# Patient Record
Sex: Male | Born: 1995 | Race: White | Hispanic: No | Marital: Single | Smoking: Current every day smoker
Health system: Southern US, Community
[De-identification: ages and names within clinical notes are randomized; demographics above are authoritative.]

## PROBLEM LIST (undated history)

## (undated) DIAGNOSIS — N5089 Other specified disorders of the male genital organs: Secondary | ICD-10-CM

---

## 2018-06-14 ENCOUNTER — Other Ambulatory Visit: Payer: Self-pay

## 2018-06-14 ENCOUNTER — Emergency Department (HOSPITAL_COMMUNITY): Payer: Self-pay

## 2018-06-14 ENCOUNTER — Emergency Department (HOSPITAL_COMMUNITY)
Admission: EM | Admit: 2018-06-14 | Discharge: 2018-06-14 | Disposition: A | Discharge status: Home / Self Care | Payer: Self-pay | Attending: Emergency Medicine | Admitting: Emergency Medicine

## 2018-06-14 ENCOUNTER — Encounter (HOSPITAL_COMMUNITY): Payer: Self-pay | Admitting: *Deleted

## 2018-06-14 DIAGNOSIS — F17200 Nicotine dependence, unspecified, uncomplicated: Secondary | ICD-10-CM | POA: Insufficient documentation

## 2018-06-14 DIAGNOSIS — R45851 Suicidal ideations: Secondary | ICD-10-CM | POA: Insufficient documentation

## 2018-06-14 DIAGNOSIS — M25571 Pain in right ankle and joints of right foot: Secondary | ICD-10-CM | POA: Insufficient documentation

## 2018-06-14 DIAGNOSIS — F15121 Other stimulant abuse with intoxication delirium: Secondary | ICD-10-CM | POA: Insufficient documentation

## 2018-06-14 DIAGNOSIS — F1910 Other psychoactive substance abuse, uncomplicated: Secondary | ICD-10-CM

## 2018-06-14 HISTORY — DX: Other specified disorders of the male genital organs: N50.89

## 2018-06-14 LAB — CBC WITH DIFFERENTIAL/PLATELET
Basophils Absolute: 0 10*3/uL (ref 0.0–0.1)
Basophils Relative: 0 %
EOS PCT: 2 %
Eosinophils Absolute: 0.2 10*3/uL (ref 0.0–0.7)
HCT: 39.5 % (ref 39.0–52.0)
Hemoglobin: 13 g/dL (ref 13.0–17.0)
LYMPHS ABS: 1.3 10*3/uL (ref 0.7–4.0)
LYMPHS PCT: 14 %
MCH: 29.5 pg (ref 26.0–34.0)
MCHC: 32.9 g/dL (ref 30.0–36.0)
MCV: 89.8 fL (ref 78.0–100.0)
MONO ABS: 0.9 10*3/uL (ref 0.1–1.0)
MONOS PCT: 9 %
Neutro Abs: 7 10*3/uL (ref 1.7–7.7)
Neutrophils Relative %: 75 %
PLATELETS: 297 10*3/uL (ref 150–400)
RBC: 4.4 MIL/uL (ref 4.22–5.81)
RDW: 13.2 % (ref 11.5–15.5)
WBC: 9.3 10*3/uL (ref 4.0–10.5)

## 2018-06-14 LAB — ETHANOL: Alcohol, Ethyl (B): <10 mg/dL (ref <10)

## 2018-06-14 LAB — BASIC METABOLIC PANEL
Anion gap: 9 (ref 5–15)
BUN: 20 mg/dL (ref 6–20)
CO2: 24 mmol/L (ref 22–32)
Calcium: 9.2 mg/dL (ref 8.9–10.3)
Chloride: 106 mmol/L (ref 98–111)
Creatinine, Ser: 0.92 mg/dL (ref 0.61–1.24)
GFR calc Af Amer: >60 mL/min (ref >60)
GLUCOSE: 106 mg/dL — AB (ref 70–99)
POTASSIUM: 3.9 mmol/L (ref 3.5–5.1)
Sodium: 139 mmol/L (ref 135–145)

## 2018-06-14 LAB — RAPID URINE DRUG SCREEN, HOSP PERFORMED
AMPHETAMINES: POSITIVE — AB
BENZODIAZEPINES: NOT DETECTED
COCAINE: NOT DETECTED
Opiates: NOT DETECTED
Tetrahydrocannabinol: NOT DETECTED

## 2018-06-14 NOTE — ED Notes (Signed)
PATIENT STATES HE FELL OFF A LADDER THIS AM BUT IS UNSURE ABOUT THE LOCATION, PER ems patient was acting out on transfer , patient states he was scared, calm at present. C/o pain in right ankle. wanded by security on admission.

## 2018-06-14 NOTE — ED Notes (Signed)
TTS in progress 
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
## 2018-06-14 NOTE — ED Triage Notes (Signed)
Brought in via [facility] after using crystal meth

## 2018-06-14 NOTE — BHH Counselor (Signed)
[name], RN, Charged Nurse notified patient is psych-cleared and can be discharged.

## 2018-06-14 NOTE — Discharge Instructions (Addendum)
Substance Abuse Treatment Programs ° °Intensive Outpatient Programs °[facility]     °[address]      °[place], [place]                   °[phone]      ° °[facility] °[address] °[place], [place] °[phone] ° °[facility]     °(Inpatient and outpatient)     °[address]           °[phone]   ° °[facility] °336-288-1484 (Suboxone and Methadone) ° °[address]      °[place], [place] [address]      °336-882-2125      ° °[address] °[place], [place] °852-3033 ° °[facility] (Outpatient/Inpatient, Chemical)    °(insurance only) [phone]      °       °[facility] (Groups & Residential) °[place], [place] °336-389-1413 ° °   °[facility]     °[address]     °[place], [place]      °[phone]      ° °[facility] (for caregivers and family) °[address] °[place], [place] °336-299-4655 ° ° ° ° ° °Residential Treatment Programs °[facility]      °[address], [place], [place] [address]  °([phone]      ° °[facility] °[address] [place], [place] [address] °[phone] ° °[facility]        °336-248-8914      ° °[facility] °1-800-659-3381 ° °[facility]             °[address]                                         °[place], [place]                                                °877-615-2722 or 336-784-9470                              ° °[facility] [place] °[address] °[place], [address] °1.877.941.8954 ° °[facility]    °[address]      °, [place]     °336-273-5306      ° °[facility] °[address] °, [place] °336-285-9073 ° °[facility]   °[address]     °[place], [place] [address]     °336-899-1550      °Admissions: 8am-3pm M-F ° °[facility] °[address] °[place] °336-227-7417 ° °[facility]: Residential Program (90 Days)   °[place], [place]      °336-725-8389 or 800-758-6077    ° °[facility] °[place], [place] °(Walk in Hours over the weekend or by referral) ° °[facility] °[address], [place], [place] [address] °(336) [phone] ° °Crisis Mobile: [facility]:  1-877-626-1772 (for crisis response 24 hours a day) °[facility] Hotline:      1-800-256-2452 °Outpatient Psychiatry and Counseling ° °[facility]: Mobile Crisis   Management 24 hours:  1-877-626-1772 ° °[facility] sliding scale fee and walk in schedule: M-F 8am-12pm/1pm-3pm °[address]  °[place], [place] [address] °336-387-6161 ° °[facility] °[address] °[place], [place] [address] °336-703-9650 ° °[facility] ([facility])- new patient walk-in appointments available Monday - Friday 8am -3pm.          °[address] °[place], [place] [address] °336-676-6840 or crisis line- 336-676-6905 ° °[facility] Intensive Outpatient Therapy Program °[address] °[place], [place] [address] °336-832-9804 ° °[facility]                  °Crisis Services      °336.641.4993      °[address]     °[place], [place] [address]                ° °[facility]   °[facility] °[phone] °[address] °[place], [place] [address] ° ° °[facility]          °[address]  °[place], [place] [address]       °(336) [phone] ° °[facility] °[address] °[place], [place] [address] °336-292-1510 ° °[facility]    °[address]    °[place], [place] [address]     °336-632-3505      ° °Parish McKinney, MD     °[address]     °[place] [address]     °336-282-1251     °  °[facility] °[address] °[place] [address] ° °[facility]     °[address]     °[place], [place]      °336-542-2076      ° °[facility] °Jacob Ahluwalia, MD °[address] °[place], [place] [address] °336-420-9558 ° °[facility]     °[address]     °[place], [place] [address]     °336-274-1237      ° °[facility] °[address] °[place], [place] [address] °336-854-4450 °Resources for Temporary Residential Assistance/Crisis Centers ° °DAY CENTERS °[facility] °M-F 8am-3pm   °[address] [place], [place] [address]   336-332-0824 °Services include: laundry, barbering, support groups, case management, phone  & computer access, showers, AA/NA mtgs, mental health/substance abuse nurse, job skills class, disability information, VA assistance, spiritual classes, etc.  ° °HOMELESS SHELTERS ° °[facility]     °[facility]   °[address], [place]     °336.271.5959       °       °[facility] (women and children)       °[address] °[place], [place] [address] °336-275-0820 °[email or web address] for application and process °Application Required ° °[facility]   °[address]    °[place] [address]     °336.886.4922       °             °[facility] °[address] °[place], [place] [address] °336.273.5572 °336-235-0363(schedule application appt.) °Application Required ° °[facility] (women only)    °[address]     °[place], [place] [address]     °336-884-1039      °  Intake starts 6pm daily Need valid ID, SSC, & Police report [facility] [place] [address] [place], [place] [phone] Application Required  [facility] (men only)     [address]      [place], [place]     [phone]       [facility] (Pregnant women only) [address] [place], [place] [phone]  [facility]      [address]      [place], [place] [address]     [phone]             [facility] [address] [place], [place] [phone] 90 day commitment/SA/Application process  [facility](men only)     [address]     [place], [place]     [phone]       Check-in at [facility]            [facility] [address] [place], [place] [address] [phone] Men/Women/Women and Children must be there by 7 pm  [facility] [place], [place] [phone]                   Call your regular medical doctor today to schedule a follow up appointment within the next week.  Call the mental health/substance abuse resources given to you today as needed for detox programs. Return to the Emergency Department immediately sooner if worsening.

## 2018-06-14 NOTE — ED Triage Notes (Signed)
States he fell off a ladder prior to arrival but does not know where he was., states he used CRYSTAL METH AT 0300 AM

## 2018-06-14 NOTE — ED Provider Notes (Signed)
[facility] EMERGENCY DEPARTMENT Provider Note   CSN: [id number] Arrival date & time: 06/14/18  1043     History   Chief Complaint Chief Complaint  Patient presents with  . V70.1    HPI Jacob Gallagher is a 22 y.o. male.   HPI  Pt was seen at 1220. Per EMS and pt report: Pt states he has recurring nightmares of his child and her mother "dying" which makes him suicidal. Pt states he climbed up on the roof of his house last night having SI/SA. Pt states he then got himself down off the roof, "got himself together and went to work." Pt states approximately 0830 he fell a few steps up on a small ladder and twisted his right ankle/foot. EMS however states they found pt "in the bushes," "acting out," with LD crystal meth at 0300. Pt states he does not recall any of that and "was just scared." Pt states he "needs to talk to someone about the nightmares." Denies HI. Denies any other injuries from the fall.      Past Medical History:  Diagnosis Date  . Swollen testicle     There are no active problems to display for this patient.   History reviewed. No pertinent surgical history.      Home Medications    Prior to Admission medications   Not on File    Family History No family history on file.  Social History Social History   Tobacco Use  . Smoking status: Current Every Day Smoker  Substance Use Topics  . Alcohol use: Not on file    Comment: RARELY  . Drug use: Yes    Types: Methamphetamines, Marijuana     Allergies   Patient has no known allergies.   Review of Systems Review of Systems ROS: Statement: All systems negative except as marked or noted in the HPI; Constitutional: Negative for fever and chills. ; ; Eyes: Negative for eye pain, redness and discharge. ; ; ENMT: Negative for ear pain, hoarseness, nasal congestion, sinus pressure and sore throat. ; ; Cardiovascular: Negative for chest pain, palpitations, diaphoresis, dyspnea and peripheral edema. ; ;  Respiratory: Negative for cough, wheezing and stridor. ; ; Gastrointestinal: Negative for nausea, vomiting, diarrhea, abdominal pain, blood in stool, hematemesis, jaundice and rectal bleeding. . ; ; Genitourinary: Negative for dysuria, flank pain and hematuria. ; ; Musculoskeletal: +right ankle/foot pain. Negative for back pain and neck pain. Negative for deformity..; ; Skin: Negative for pruritus, rash, abrasions, blisters, bruising and skin lesion.; ; Neuro: Negative for headache, lightheadedness and neck stiffness. Negative for weakness, altered level of consciousness, altered mental status, extremity weakness, paresthesias, involuntary movement, seizure and syncope.; Psych:  +SI, no HI, no hallucinations.      Physical Exam Updated Vital Signs BP (!) 154/82 (BP Location: Right Arm)   Pulse (!) 115   Temp 98.5 F (36.9 C) (Oral)   Resp 20   Ht 5\' 7"  (1.702 m)   Wt 61.2 kg (135 lb)   SpO2 100%   BMI 21.14 kg/m   Physical Exam 1225: Physical examination:  Nursing notes reviewed; Vital signs and O2 SAT reviewed;  Constitutional: Well developed, Well nourished, Well hydrated, In no acute distress; Head:  Normocephalic, atraumatic; Eyes: EOMI, PERRL, No scleral icterus; ENMT: Mouth and pharynx normal, Mucous membranes moist; Neck: Supple, Full range of motion; Cardiovascular: Regular rate and rhythm; Respiratory: Breath sounds clear, No wheezes.  Speaking full sentences with ease, Normal respiratory effort/excursion; Chest: No deformity, Movement  normal; Abdomen: Nondistended; Extremities: No deformity. Pelvis stable. +tender to palp right lateral maleolar area w/localized edema, NMS intact right foot, strong pedal pp, LE muscle compartments soft.  No right proximal fibular head tenderness, no knee tenderness, no foot tenderness.  No deformity, no ecchymosis, no open wounds.  +plantarflexion of right foot w/calf squeeze.  No palpable gap right Achilles's tendon.;;  Spine:  No midline CS, TS, LS  tenderness.;; Neuro: AA&Ox3, Major CN grossly intact.  Speech clear. No gross focal motor deficits in extremities. Climbs on and off stretcher easily by himself. Gait steady.; Skin: Color normal, Warm, Dry.; Psych:  Affect flat.    ED Treatments / Results  Labs (all labs ordered are listed, but only abnormal results are displayed)   EKG None  Radiology   Procedures Procedures (including critical care time)  Medications Ordered in ED Medications - No data to display   Initial Impression / Assessment and Plan / ED Course  I have reviewed the triage vital signs and the nursing notes.  Pertinent labs & imaging results that were available during my care of the patient were reviewed by me and considered in my medical decision making (see chart for details).  MDM Reviewed: previous chart, nursing note and vitals Reviewed previous: labs Interpretation: labs and x-ray   Results for orders placed or performed during the hospital encounter of 06/14/18  Urine rapid drug screen (hosp performed)  Result Value Ref Range   Opiates NONE DETECTED NONE DETECTED   Cocaine NONE DETECTED NONE DETECTED   Benzodiazepines NONE DETECTED NONE DETECTED   Amphetamines POSITIVE (A) NONE DETECTED   Tetrahydrocannabinol NONE DETECTED NONE DETECTED   Barbiturates (A) NONE DETECTED    Result not available. Reagent lot number recalled by manufacturer.  Ethanol  Result Value Ref Range   Alcohol, Ethyl (B) <10 <10 mg/dL  Basic metabolic panel  Result Value Ref Range   Sodium 139 135 - 145 mmol/L   Potassium 3.9 3.5 - 5.1 mmol/L   Chloride 106 98 - 111 mmol/L   CO2 24 22 - 32 mmol/L   Glucose, Bld 106 (H) 70 - 99 mg/dL   BUN 20 6 - 20 mg/dL   Creatinine, Ser 1.610.92 0.61 - 1.24 mg/dL   Calcium 9.2 8.9 - 09.610.3 mg/dL   GFR calc non Af Amer >60 >60 mL/min   GFR calc Af Amer >60 >60 mL/min   Anion gap 9 5 - 15  CBC with Differential  Result Value Ref Range   WBC 9.3 4.0 - 10.5 K/uL   RBC 4.40 4.22 -  5.81 MIL/uL   Hemoglobin 13.0 13.0 - 17.0 g/dL   HCT 04.539.5 40.939.0 - 81.152.0 %   MCV 89.8 78.0 - 100.0 fL   MCH 29.5 26.0 - 34.0 pg   MCHC 32.9 30.0 - 36.0 g/dL   RDW 91.413.2 78.211.5 - 95.615.5 %   Platelets 297 150 - 400 K/uL   Neutrophils Relative % 75 %   Neutro Abs 7.0 1.7 - 7.7 K/uL   Lymphocytes Relative 14 %   Lymphs Abs 1.3 0.7 - 4.0 K/uL   Monocytes Relative 9 %   Monocytes Absolute 0.9 0.1 - 1.0 K/uL   Eosinophils Relative 2 %   Eosinophils Absolute 0.2 0.0 - 0.7 K/uL   Basophils Relative 0 %   Basophils Absolute 0.0 0.0 - 0.1 K/uL   Dg Ankle Complete Right Result Date: 06/14/2018 CLINICAL DATA:  Right foot and ankle swelling after falling off a ladder. EXAM:  RIGHT FOOT COMPLETE - 3+ VIEW; RIGHT ANKLE - COMPLETE 3+ VIEW COMPARISON:  None. FINDINGS: No acute fracture or dislocation. The ankle mortise is symmetric. The talar dome is intact. No tibiotalar joint effusion. Joint spaces are preserved. Bone mineralization is normal. Soft tissues are unremarkable. IMPRESSION: No acute osseous abnormality of the right foot and ankle. Electronically Signed   By: Obie Dredge M.D.   On: 06/14/2018 13:28   Dg Foot Complete Right Result Date: 06/14/2018 CLINICAL DATA:  Right foot and ankle swelling after falling off a ladder. EXAM: RIGHT FOOT COMPLETE - 3+ VIEW; RIGHT ANKLE - COMPLETE 3+ VIEW COMPARISON:  None. FINDINGS: No acute fracture or dislocation. The ankle mortise is symmetric. The talar dome is intact. No tibiotalar joint effusion. Joint spaces are preserved. Bone mineralization is normal. Soft tissues are unremarkable. IMPRESSION: No acute osseous abnormality of the right foot and ankle. Electronically Signed   By: Obie Dredge M.D.   On: 06/14/2018 13:28    1545:  XR reassuring; will place ASO. Will have TTS evaluate.   1620:  TTS has evaluated pt:  Pt now admits he snorted ICE, then had nightmare, pt denies SI/HI/psychosis, does not meet inpt criteria and can be d/c with outpt f/u. Will d/c  stable.      Final Clinical Impressions(s) / ED Diagnoses   Final diagnoses:  None    ED Discharge Orders    None       Samuel Jester, DO 06/16/18 1453

## 2018-06-14 NOTE — ED Notes (Signed)
Pt denies any thoughts of SI at present, States a few weeks ago he started having night mares, causing him to think about death. Patient admits to drug use, mostly marijuana lately.

## 2019-08-20 IMAGING — CR DG ANKLE COMPLETE 3+V*R*
1 series · 3 of 3 positions shown · non-contrast
Comparison: None.

CLINICAL DATA: Right foot and ankle swelling after falling off a
ladder.

EXAM:
RIGHT FOOT COMPLETE - 3+ VIEW; RIGHT ANKLE - COMPLETE 3+ VIEW

[Series 1: ap · 0.17mm/px · 3 of 3 slices shown]
[im 1/3]
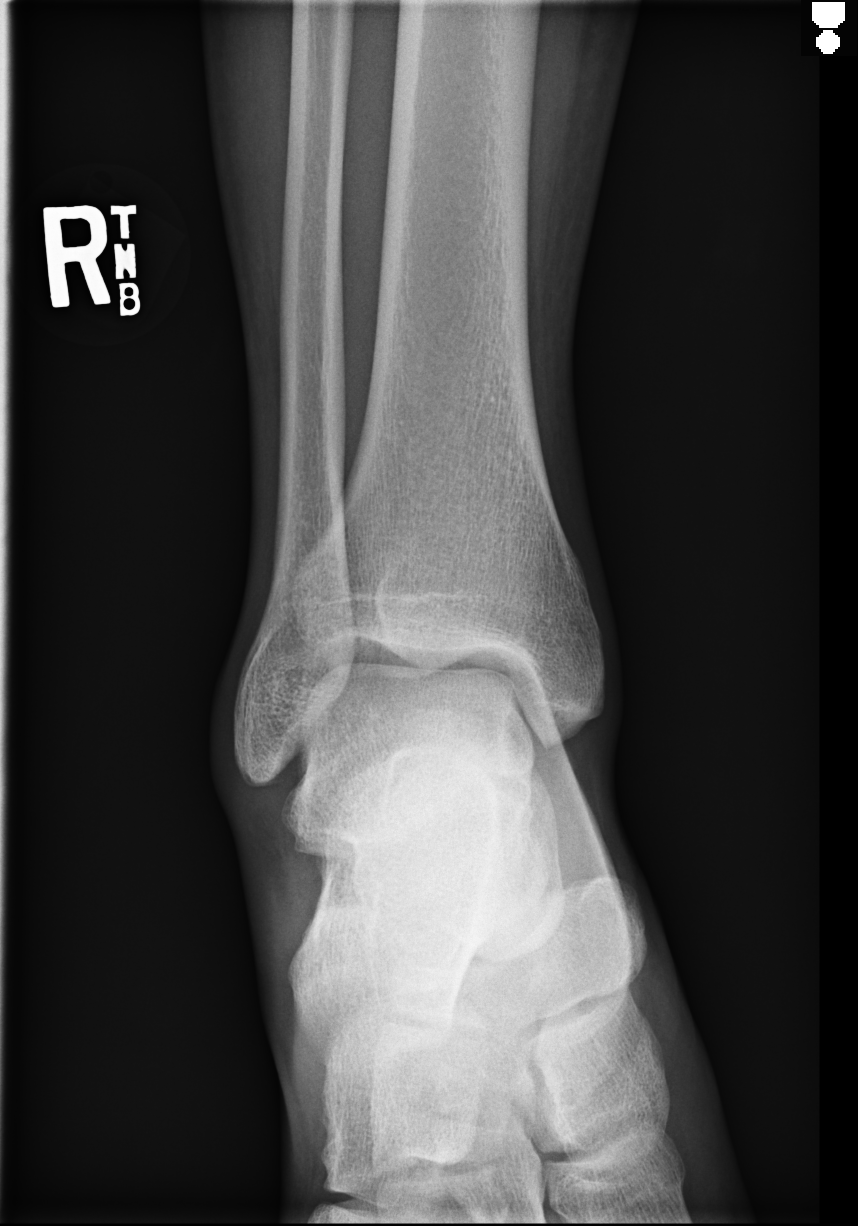
[im 2/3]
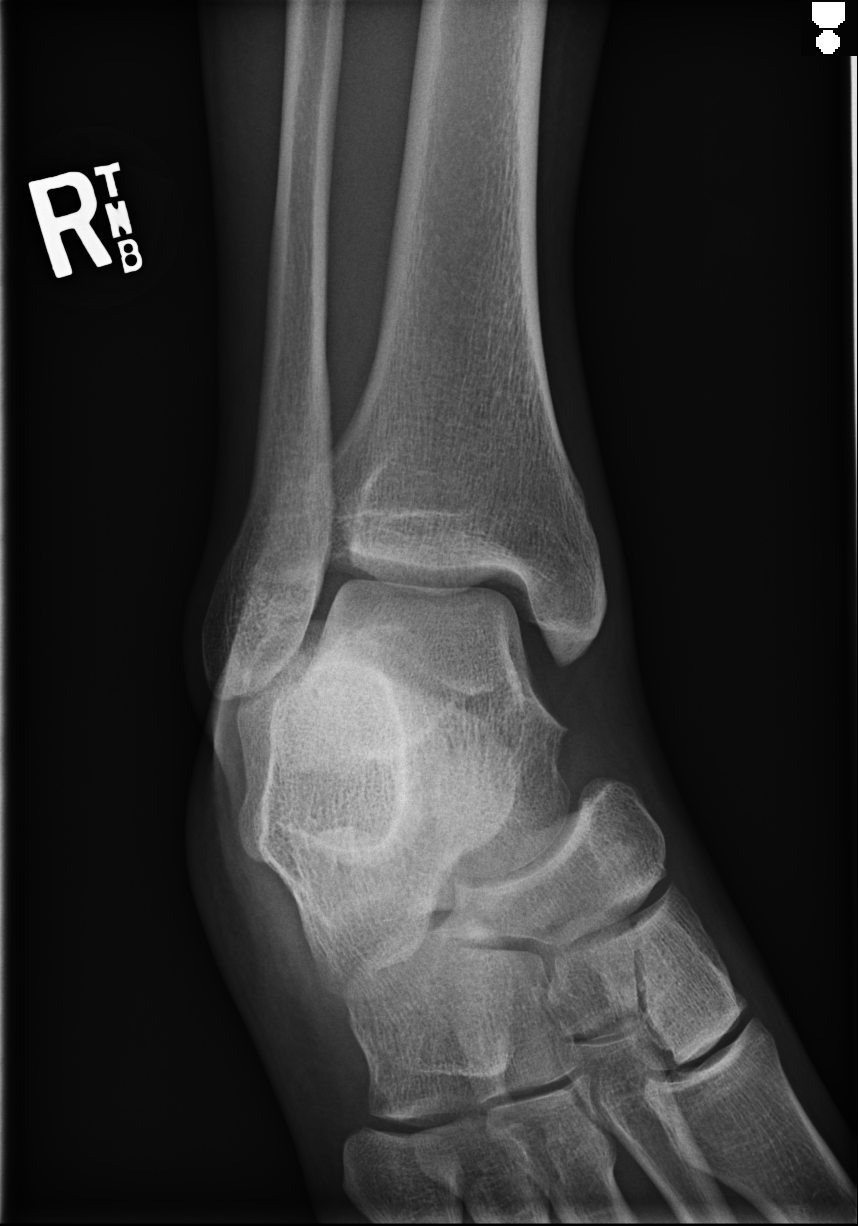
[im 3/3]
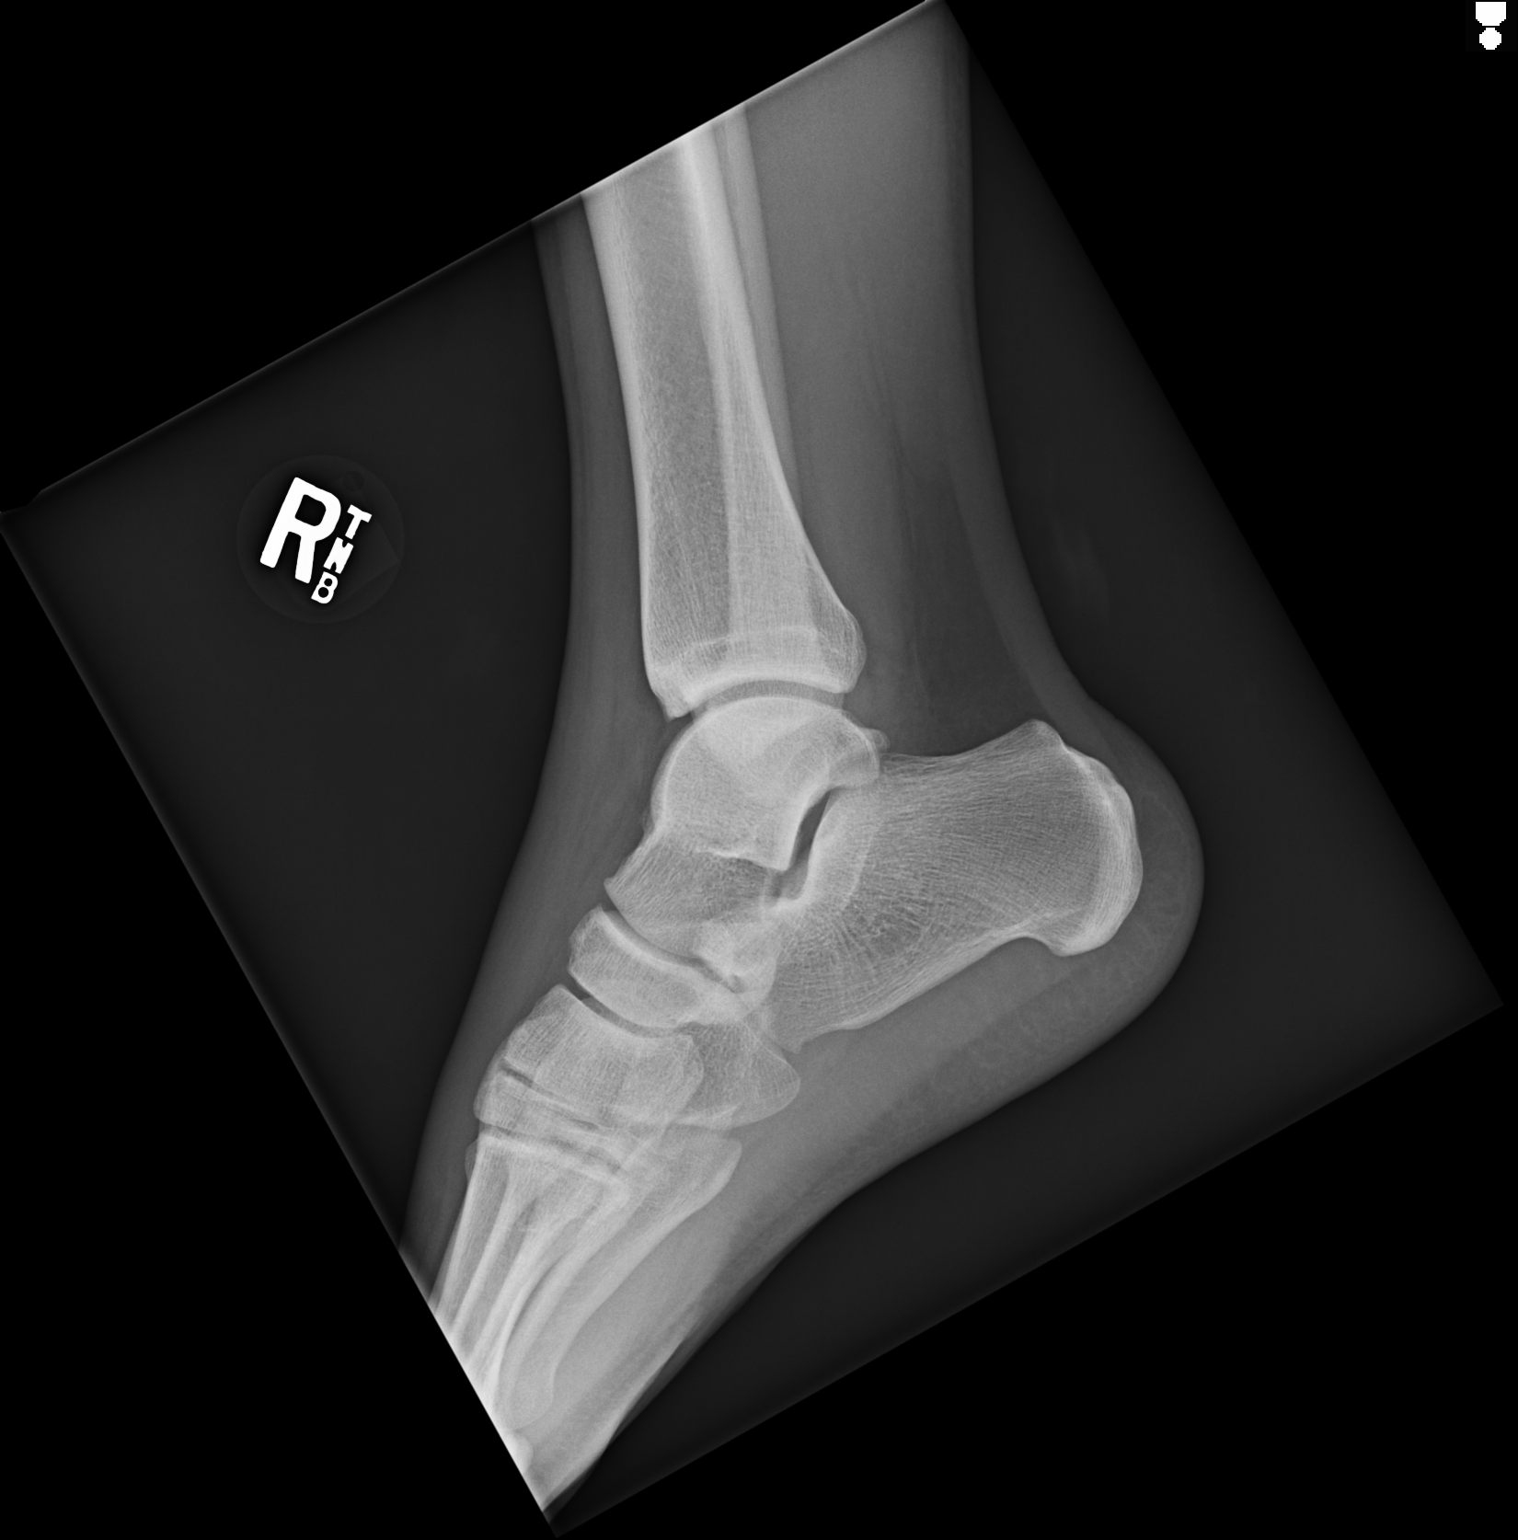

[3 of 3 positions shown; findings below may reference images not displayed]

FINDINGS: No acute fracture or dislocation. The ankle mortise is symmetric.
The talar dome is intact. No tibiotalar joint effusion. Joint spaces
are preserved. Bone mineralization is normal. Soft tissues are
unremarkable.
IMPRESSION: No acute osseous abnormality of the right foot and ankle.
